# Patient Record
Sex: Female | Born: 1995 | Race: White | Hispanic: No | Marital: Single | Smoking: Current every day smoker
Health system: Southern US, Community
[De-identification: ages and names within clinical notes are randomized; demographics above are authoritative.]

## PROBLEM LIST (undated history)

## (undated) DIAGNOSIS — J45909 Unspecified asthma, uncomplicated: Secondary | ICD-10-CM

## (undated) DIAGNOSIS — T7840XA Allergy, unspecified, initial encounter: Secondary | ICD-10-CM

## (undated) DIAGNOSIS — F419 Anxiety disorder, unspecified: Secondary | ICD-10-CM

## (undated) DIAGNOSIS — E119 Type 2 diabetes mellitus without complications: Secondary | ICD-10-CM

## (undated) HISTORY — DX: Type 2 diabetes mellitus without complications: E11.9

## (undated) HISTORY — DX: Unspecified asthma, uncomplicated: J45.909

## (undated) HISTORY — DX: Allergy, unspecified, initial encounter: T78.40XA

## (undated) HISTORY — DX: Anxiety disorder, unspecified: F41.9

---
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
## 2014-08-16 IMAGING — CT CT HEAD WITHOUT CONTRAST
1 series · 16 of 30 positions shown, 20 images · non-contrast
Comparison: None.

CLINICAL DATA: Syncope

EXAM:
CT HEAD WITHOUT CONTRAST
TECHNIQUE: Contiguous axial images were obtained from the base of the skull
through the vertex without intravenous contrast.

[Series 2: head wo · axial · 0.41mm/px · z∈[-103,+25]mm · 16 of 30 slices shown, 20 images]
[im 2/30  brain]
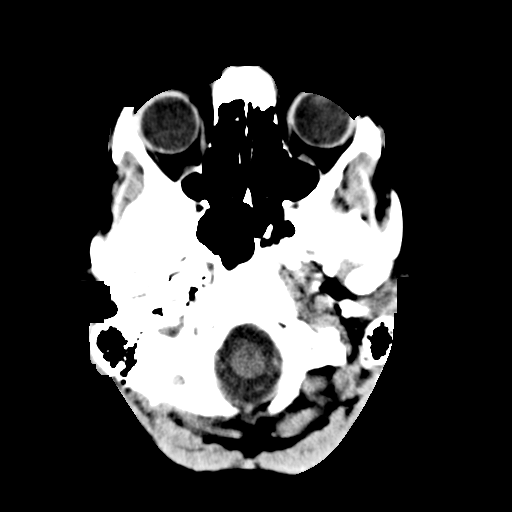
[im 2/30  bone]
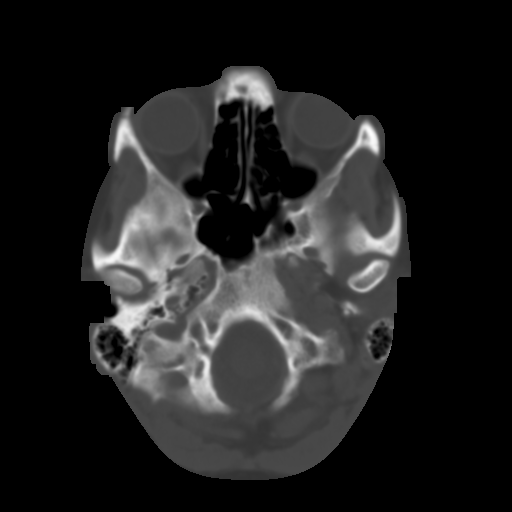
[im 4/30  brain]
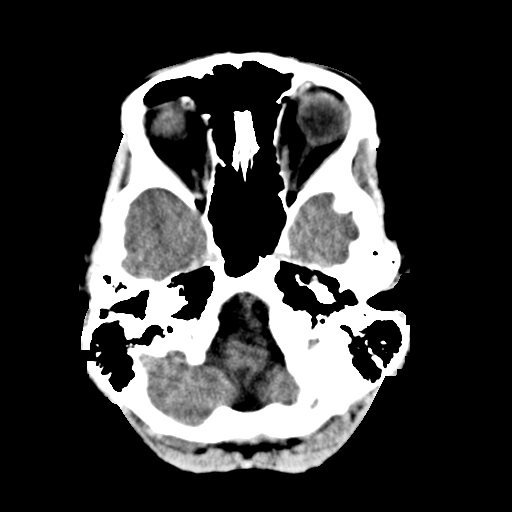
[im 6/30  brain]
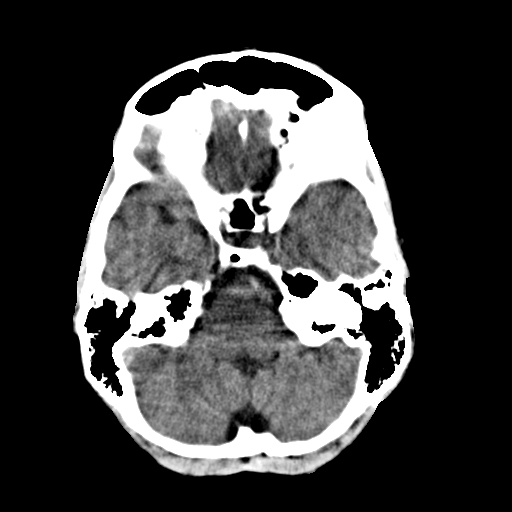
[im 8/30  brain]
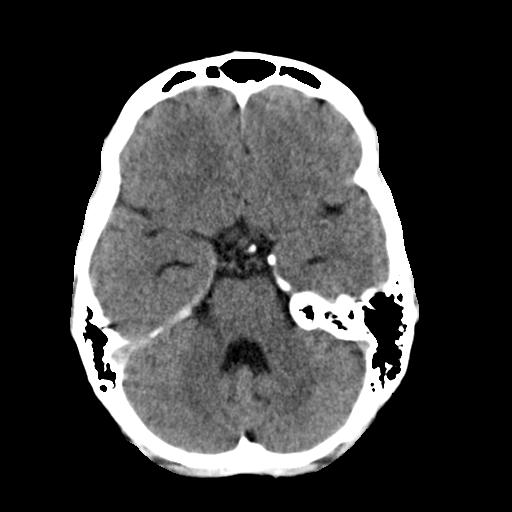
[im 9/30  brain]
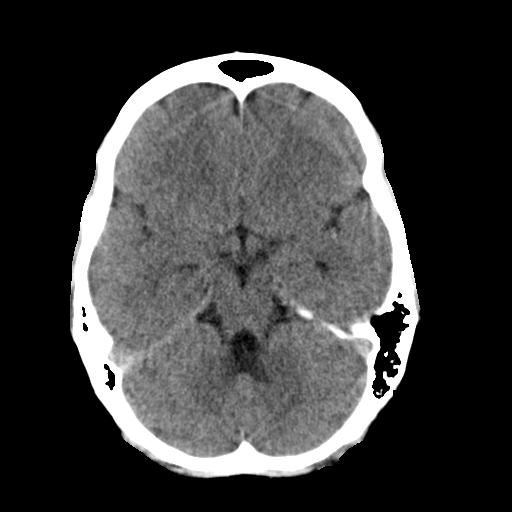
[im 9/30  bone]
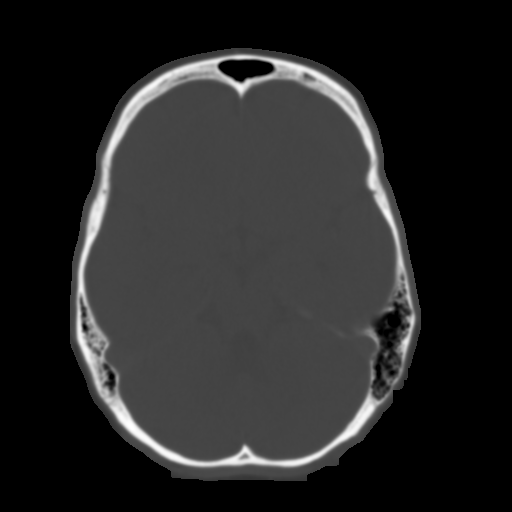
[im 11/30  brain]
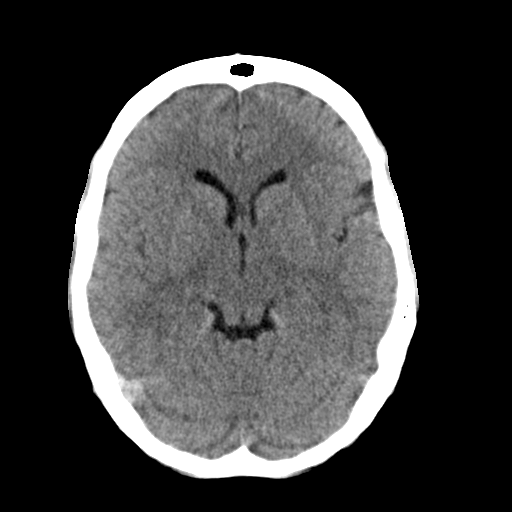
[im 13/30  brain]
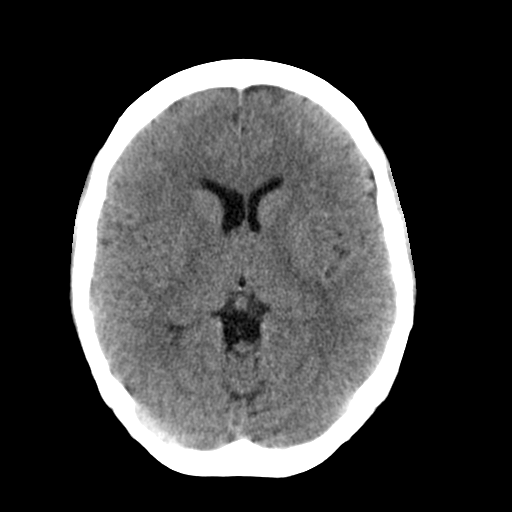
[im 15/30  brain]
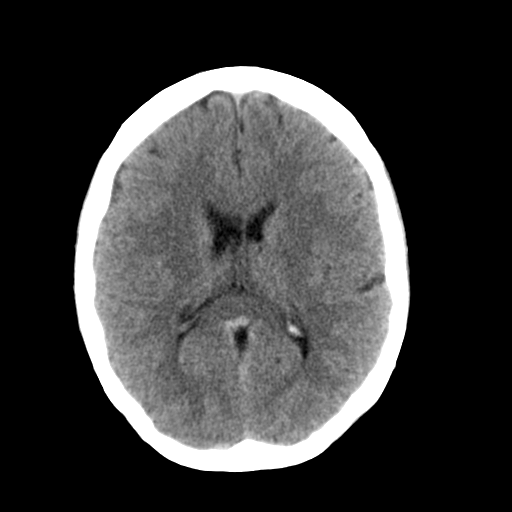
[im 16/30  brain]
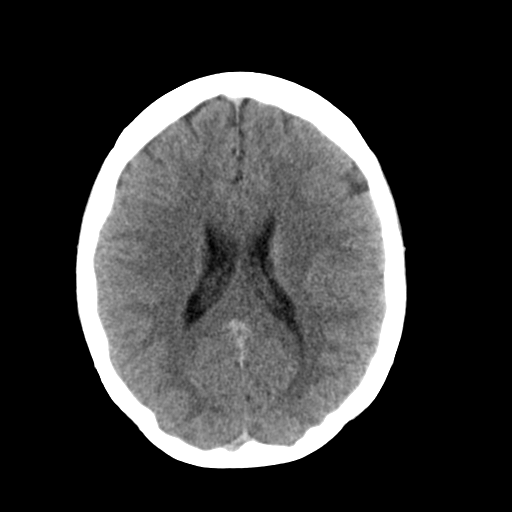
[im 16/30  bone]
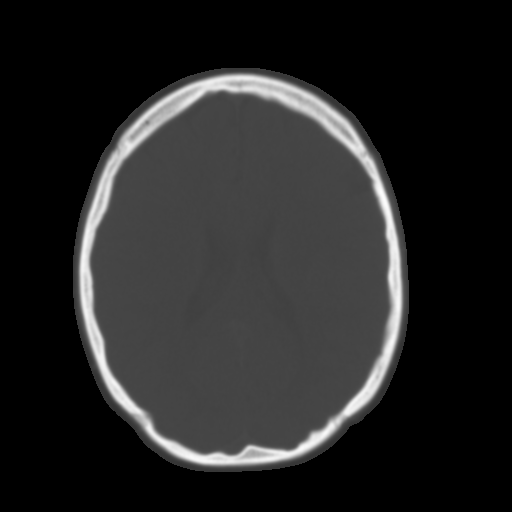
[im 18/30  brain]
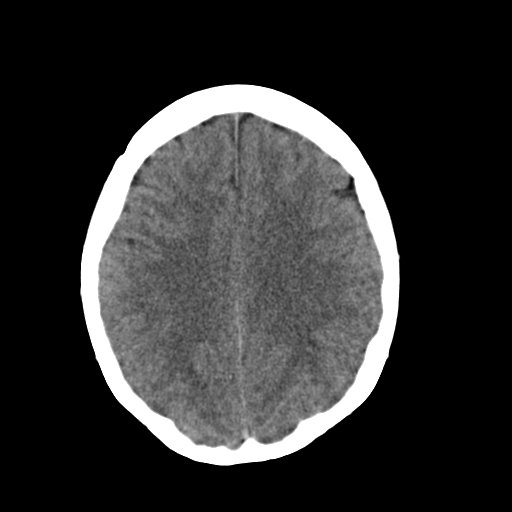
[im 20/30  brain]
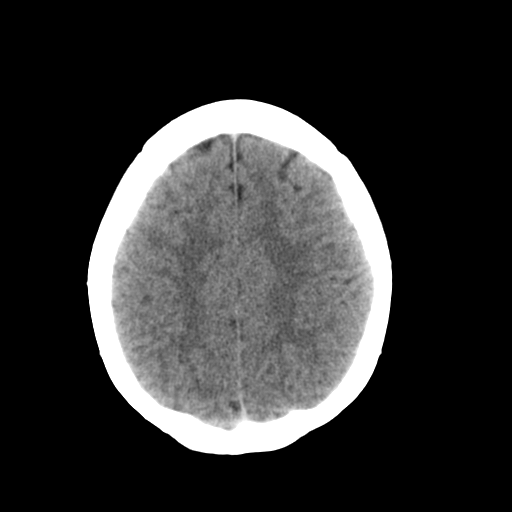
[im 22/30  brain]
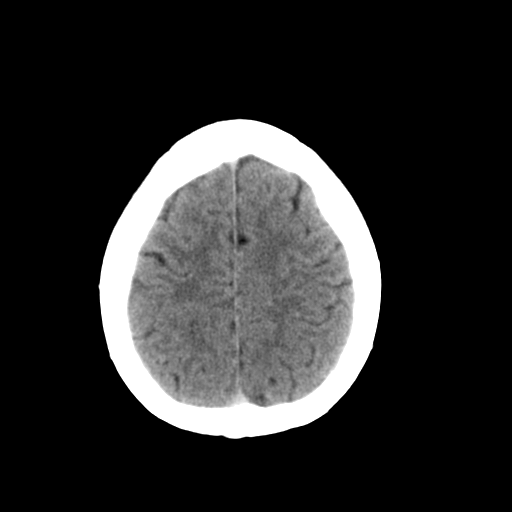
[im 23/30  brain]
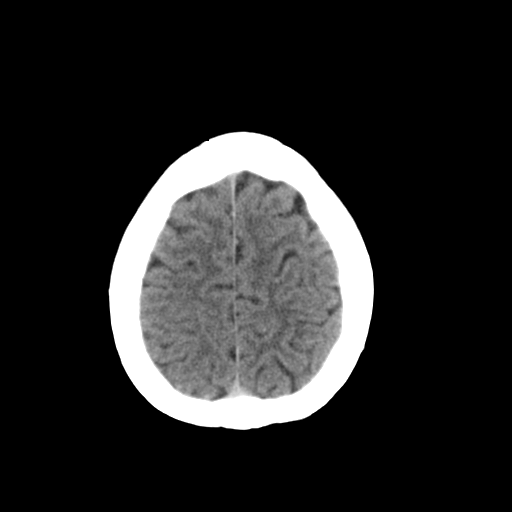
[im 23/30  bone]
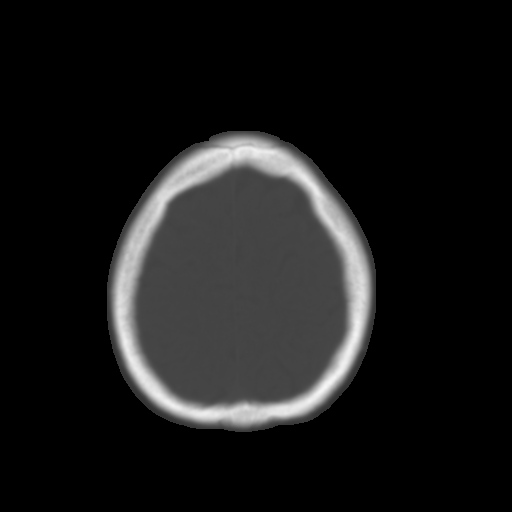
[im 25/30  brain]
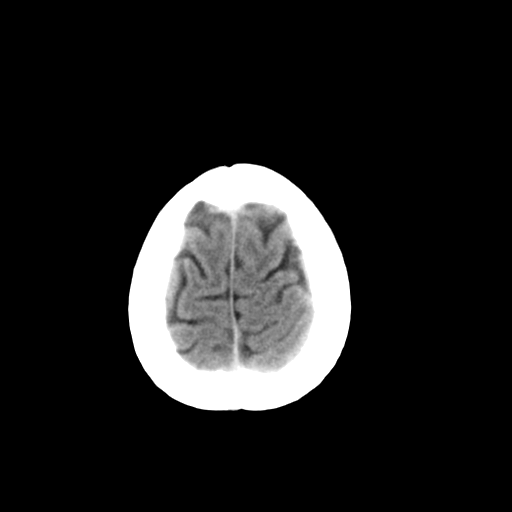
[im 27/30  brain]
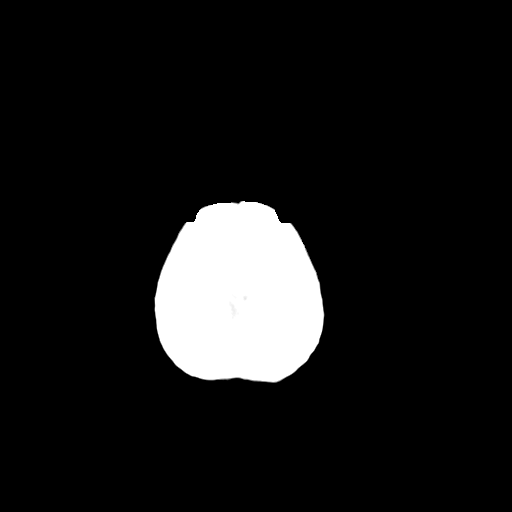
[im 29/30  brain]
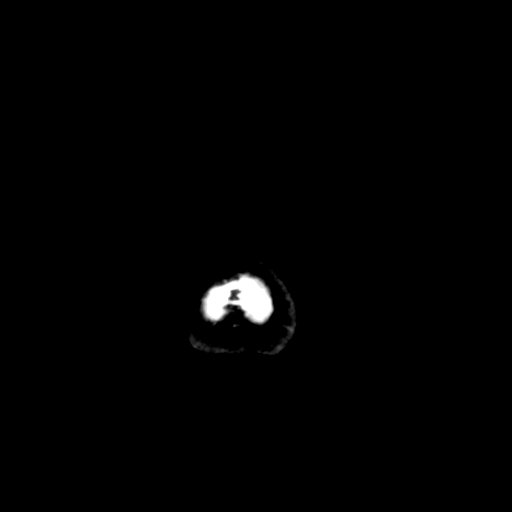

[16 of 30 positions shown; findings below may reference images not displayed]

FINDINGS: No acute intracranial abnormality is identified. Specifically,
negative for intra or extra-axial hemorrhage, mass effect, mass
lesion, hydrocephalus, or evidence of acute cortically based
infarction. The skull is intact. The visualized paranasal sinuses
and mastoid air cells are clear.
IMPRESSION: No acute intracranial abnormality.

## 2014-09-06 ENCOUNTER — Ambulatory Visit: Payer: Self-pay | Admitting: Family Medicine

## 2014-10-12 ENCOUNTER — Encounter
Admit: 2014-10-12 | Disposition: A | Discharge status: Home / Self Care | Payer: Self-pay | Attending: Family Medicine | Admitting: Family Medicine

## 2014-10-20 ENCOUNTER — Ambulatory Visit: Payer: Medicaid Other | Attending: Family Medicine | Admitting: Physical Therapy

## 2014-10-20 ENCOUNTER — Encounter: Payer: Self-pay | Admitting: Physical Therapy

## 2014-10-20 DIAGNOSIS — S39012S Strain of muscle, fascia and tendon of lower back, sequela: Secondary | ICD-10-CM | POA: Diagnosis not present

## 2014-10-20 DIAGNOSIS — S134XXS Sprain of ligaments of cervical spine, sequela: Secondary | ICD-10-CM | POA: Insufficient documentation

## 2014-10-20 DIAGNOSIS — M545 Low back pain, unspecified: Secondary | ICD-10-CM

## 2014-10-20 DIAGNOSIS — M542 Cervicalgia: Secondary | ICD-10-CM | POA: Insufficient documentation

## 2014-10-20 DIAGNOSIS — R293 Abnormal posture: Secondary | ICD-10-CM | POA: Insufficient documentation

## 2014-10-20 DIAGNOSIS — X58XXXS Exposure to other specified factors, sequela: Secondary | ICD-10-CM | POA: Insufficient documentation

## 2014-10-20 NOTE — Patient Instructions (Signed)
Neck: Retraction   Sit with back and head straight. Pull chin back to line up ear with shoulder. Do not turn or tilt head.  Hold _5___ seconds. Repeat 10____ times. Do __2__ sessions per day. CAUTION: Movement should be gentle, steady and slow.   Copyright  VHI. All rights reserved.  Shoulder Retraction   Facing chest height anchor, grasp ends of band and pull hands to chest, squeezing shoulder blades together. Hold _2__ seconds. Repeat _10__ times. Do 2___ sessions per day. Safety Note: Be sure anchor is secure.  Copyright  VHI. All rights reserved.  Shoulder Extension (Sit Box)   Use green tband tied around door knob. Pull arms backward. Keep spine neutral and squeeze shoulder blades together. Do ___ times, ___ times per day.  Copyright  VHI. All rights reserved.  Copyright  VHI. All rights reserved.  Reverse Fly / Shoulder Retraction   Extend both arms in front of body at shoulder height, palms down, holding green band. Move arms out to sides, squeeze shoulder blades together. Repeat _10__ times. Do __2_ sessions per day.   Copyright  VHI. All rights reserved.   Finish with posterior shoulder rolls (arms by side and roll shoulders backwards) repeat 10 times 2x a day;  You could also lay down on back with two tennis balls (taped together) under base of skill for 2-3 minutes (or as tolerated)
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
## 2014-10-22 ENCOUNTER — Ambulatory Visit: Payer: Medicaid Other | Attending: Family Medicine | Admitting: Physical Therapy

## 2014-10-22 ENCOUNTER — Encounter: Payer: Self-pay | Admitting: Physical Therapy

## 2014-10-22 DIAGNOSIS — R293 Abnormal posture: Secondary | ICD-10-CM

## 2014-10-22 DIAGNOSIS — M545 Low back pain, unspecified: Secondary | ICD-10-CM

## 2014-10-22 DIAGNOSIS — S134XXS Sprain of ligaments of cervical spine, sequela: Secondary | ICD-10-CM | POA: Diagnosis not present

## 2014-10-22 DIAGNOSIS — M542 Cervicalgia: Secondary | ICD-10-CM

## 2014-10-22 NOTE — Patient Instructions (Signed)
CERVICAL ROTATION WITH OVERPRESSURE -- IMAGE FROM [email or web address]   With good posture rotate your head to one side as far as possible without pain and use two fingers on the opposite side (if you turn to the left, 2 fingers on right cheek) and rotate back to the middle, then turn back further to the left.   Perform as needed. (10 repetitions 2 second hold)   CERVICAL FLEXION -- Image from [email or web address]   Tilt your head downwards, then return back to looking straight ahead.    Go to the point of stretch, no Pain. Can add overpressure with your hand at the top of the head.   Perform as needed. (10 repetitions 3 second hold)   Anterior/Med Scalene Stretch -- Image from [email or web address]   Pull your head to the side with one arm while the other arm stays in a relaxed position by your side.  Next look towards the ceiling while you are continuing to pull with one hand.  Finally, apply a chin tuck to the final position for the final stretch.    Perform as needed!  (10 repetitions 3 second hold)

## 2014-10-22 NOTE — Therapy (Signed)
[place] [facility] [address] [place], [place], [address] Phone: [phone]   Fax:  [phone]  Physical Therapy Treatment  Patient Details  Name: Jessica Calderon MRN: [id number] Date of Birth: June 23, 1995 Referring Provider:  No ref. provider found  Encounter Date: 10/22/2014      PT End of Session - 10/22/14 0905    Visit Number 3   Number of Visits 8   Date for PT Re-Evaluation 11/09/14   PT Start Time 0822   PT Stop Time 0905   PT Time Calculation (min) 43 min   Activity Tolerance Patient tolerated treatment well   Behavior During Therapy [facility] for tasks assessed/performed;Flat affect      Past Medical History  Diagnosis Date  . Anxiety   . Diabetes mellitus without complication     hypoglycemia  . Allergy   . Asthma     No past surgical history on file.  There were no vitals filed for this visit.  Visit Diagnosis:  Right-sided low back pain without sciatica  Cervicalgia  Abnormal posture      Subjective Assessment - 10/22/14 0822    Subjective Patient reports that her pain is off an on, it is worst in the mornings and evenings. It really depends on what she does, she states that laying in bed, gardening, and working with a doghouse have increased her symptoms.    Limitations Other (comment)  Limited driving.   Currently in Pain? Yes   Pain Score 4    Pain Location Neck   Pain Descriptors / Indicators Aching   Pain Type Chronic pain   Pain Radiating Towards Right side radiating towards left    Pain Onset 1 to 4 weeks ago   Pain Frequency Intermittent   Multiple Pain Sites Yes   Pain Score 4   Pain Location Back   Pain Orientation Lower   Pain Descriptors / Indicators Aching   Pain Type Chronic pain   Pain Onset 1 to 4 weeks ago   Pain Frequency Intermittent     Manual therapy  P-A Thoracic mobilizations provided grade I for pain relief Soft tissue mobilizations provided to rhomboids, upper  trapezius, and scalenes bilaterally for pain relief. Well tolerated.  First rib mobilizaiton grade I for pain relief provided on right in prone. Well tolerated.    Therapeutic Exercise  Low rows with green band x 10 with cuing for scapular adduction and retraction, rather than glenohumeral extension.  Mid rows with green band x 10 with cuing for scapular adduction and retraction, rather than glenohumeral extension. Band pull aparts x 10 with green band with cuing for scapular adduction and retraction, rather than glenohumeral extension Patient educated on sit <--> stand technique and carry over for squat. Patient educated that squatting with neutral spine would alleviate pressure/pain she notes with picking objects off of the floor. Patient completed 5 x repetitions of sit to stand, and cued to have wider stance and some external rotation at her feet to minimize her need for DF. Patient acknowledged no pain while picking up 7# dumbbell. Patient educated to lift objects when they are as close to her as possible. She completed x 8 repetitions with 7# dumbbell.                            PT Education - 10/22/14 0909    Education provided Yes   Education Details Increased HEP, provided  patient with therapeutic neuroscience education, reinforced HEP by repeating initially provactive movements and asking for changes.    Person(s) Educated Patient   Methods Explanation;Demonstration;Verbal cues;Handout   Comprehension Verbalized understanding;Returned demonstration;Tactile cues required             PT Long Term Goals - 10/22/14 0914    PT LONG TERM GOAL #1   Title Patient will report a maximum of 2 sleep disturbances in the last week to improve overall quality of life and reduce fatigue; target 11/09/14   Status New   PT LONG TERM GOAL #2   Title Patietn will be independent in HEP to improve strength/mobility for better functional independence with ADLs by 11/09/14    Status New   PT LONG TERM GOAL #3   Title Patient will report a worst pain of 3/10 on VAS in neck/low back to improve tolerance with ADLs and reduced symptoms with activities by 11/09/14   Status New   PT LONG TERM GOAL #4   Title Patient will icnrease BLE gross strength to 4+/5 as to improve functioanl strength fo rindependent gait, increased standing tolerance and increased ADL ability by 11/09/14   Status New   PT LONG TERM GOAL #5   Title Patient will increase lumbar extension strength to at least 4/5 as to improve bgross strength fo rsitting/standing tolerance with better erect posture for increased toelrance with ADLs by 11/09/14   Status New   PT LONG TERM GOAL #6   Title Patietn will reduce modified Oswestry score to <20 as to demonstrate minimal disability with ADLs including improved sleeping tolerance, walking/sitting tolerance etc for better mobility with ADLs by 11/09/14   Status New               Plan - 10/22/14 0911    Clinical Impression Statement Patient is a 19 y/o female that presents with cervical and mid thoracic pain, which radiates bilaterally. Patient responded very well to manual techniques and self-stretching program provided and stated she did not have any pain or restriction in motion after treatment today. Patient performed repeated testing with cervical ROM after interventions, and stated she was pain free after additional stretches added to her HEP today. Patient educated to continue stretching and strengthening program as needed, as it was shwon to be effective today. Patient would benefit from continued skilled PT to address any additional symptoms she [place] experience as well as improve her posture both statically and dynamically with lifting tasks.    Pt will benefit from skilled therapeutic intervention in order to improve on the following deficits Improper body mechanics;Impaired flexibility;Decreased range of motion;Postural dysfunction;Decreased activity  tolerance;Pain;Decreased strength;Decreased mobility   Rehab Potential Excellent  Excellent response to treatment today.   PT Frequency 2x / week   PT Duration 4 weeks   PT Treatment/Interventions Therapeutic exercise;Therapeutic activities;Dry needling;ADLs/Self Care Home Management;Cryotherapy;Moist Heat;Ultrasound;Electrical Stimulation;Manual techniques   PT Next Visit Plan Follow up on HEP and assess for any modifications. Perform manual treatments as needed, reinforce proper lifting mechanics.    PT Home Exercise Plan please see patient instructions   Consulted and Agree with Plan of Care Patient        Problem List There are no active problems to display for this patient.  Kerin RansomPatrick A McNamara, PT, DPT     [place] [facility] [address] [place], [place], [address] Phone: [phone]   Fax:  [phone]

## 2014-10-27 ENCOUNTER — Encounter: Payer: Self-pay | Admitting: Physical Therapy

## 2014-10-27 ENCOUNTER — Ambulatory Visit: Payer: Medicaid Other | Attending: Family Medicine | Admitting: Physical Therapy

## 2014-10-27 DIAGNOSIS — X58XXXS Exposure to other specified factors, sequela: Secondary | ICD-10-CM | POA: Insufficient documentation

## 2014-10-27 DIAGNOSIS — S134XXS Sprain of ligaments of cervical spine, sequela: Secondary | ICD-10-CM | POA: Insufficient documentation

## 2014-10-27 DIAGNOSIS — S39012S Strain of muscle, fascia and tendon of lower back, sequela: Secondary | ICD-10-CM | POA: Insufficient documentation

## 2014-10-27 DIAGNOSIS — R293 Abnormal posture: Secondary | ICD-10-CM

## 2014-10-27 DIAGNOSIS — M545 Low back pain, unspecified: Secondary | ICD-10-CM

## 2014-10-27 DIAGNOSIS — M542 Cervicalgia: Secondary | ICD-10-CM

## 2014-10-27 NOTE — Therapy (Signed)
[place] [facility] [address] [place], [place], [address] Phone: [phone]   Fax:  [phone]  Physical Therapy Treatment  Patient Details  Name: Thane Edulesa Taylor Rentz MRN: [id number] Date of Birth: 1995/07/14 Referring Provider:  Sula RumpleVirk, Charanjit, MD  Encounter Date: 10/27/2014      PT End of Session - 10/27/14 1158    Visit Number 4   Number of Visits 8   Date for PT Re-Evaluation 11/09/14   PT Start Time 1130   PT Stop Time 1200   PT Time Calculation (min) 30 min   Equipment Utilized During Treatment --  moist heat   Activity Tolerance Patient tolerated treatment well   Behavior During Therapy [facility] for tasks assessed/performed;Flat affect      Past Medical History  Diagnosis Date  . Anxiety   . Diabetes mellitus without complication     hypoglycemia  . Allergy   . Asthma     History reviewed. No pertinent past surgical history.  There were no vitals filed for this visit.  Visit Diagnosis:  Right-sided low back pain without sciatica  Cervicalgia  Abnormal posture      Subjective Assessment - 10/27/14 1133    Subjective Patient denies any pain currently; She reports mixed compliance with HEP as she was busy over the weekend with Mother's Day. Patient reports waking up once without pain; She reports having to stay in hospital with family member and having pain then.    Limitations Other (comment)   Currently in Pain? No/denies            [facility] PT Assessment - 10/27/14 0001    Functional Tests   Functional tests --   Standardized Balance Assessment   Standardized Balance Assessment --     TREATMENT: Warm up on UBE, BUE level 2 backwards only x3 min (unbilled); Prone: -BUE shoulder extension 2# x10; -BUE shoulder horizontal abduction 2# x10; -UE rows 2#, 2x10 bilaterally  Pball on wall push-ups x10 with cues for scapular protraction upon push-up; Standing facing wall: Alternate UE lift off wall  with scapular retraction/depression x10 bilaterally;  Finished with posterior shoulder rolls x10;  Patient required min-moderate verbal/tactile cues for correct exercise technique including to increase scapular retraction during tband rows/extension for better postural strengthening; Patient reports increased fatigue and discomfort after exercise related to muscle weakness.   Finished with moist heat to thoracolumbar paraspinals x5 min (unbilled);                      PT Education - 10/27/14 1156    Education provided Yes   Education Details posture and correct exercise technique;    Person(s) Educated Patient   Methods Explanation;Demonstration   Comprehension Verbalized understanding;Returned demonstration;Verbal cues required             PT Long Term Goals - 10/22/14 0914    PT LONG TERM GOAL #1   Title Patient will report a maximum of 2 sleep disturbances in the last week to improve overall quality of life and reduce fatigue; target 11/09/14   Status New   PT LONG TERM GOAL #2   Title Patietn will be independent in HEP to improve strength/mobility for better functional independence with ADLs by 11/09/14   Status New   PT LONG TERM GOAL #3   Title Patient will report a worst pain of 3/10 on VAS in neck/low back to improve tolerance with ADLs and reduced symptoms with activities by  11/09/14   Status New   PT LONG TERM GOAL #4   Title Patient will icnrease BLE gross strength to 4+/5 as to improve functioanl strength fo rindependent gait, increased standing tolerance and increased ADL ability by 11/09/14   Status New   PT LONG TERM GOAL #5   Title Patient will increase lumbar extension strength to at least 4/5 as to improve bgross strength fo rsitting/standing tolerance with better erect posture for increased toelrance with ADLs by 11/09/14   Status New   PT LONG TERM GOAL #6   Title Patietn will reduce modified Oswestry score to <20 as to demonstrate minimal  disability with ADLs including improved sleeping tolerance, walking/sitting tolerance etc for better mobility with ADLs by 11/09/14   Status New               Plan - 10/27/14 1141    Clinical Impression Statement Patient was late to appointment; Focused on postural strengthening with resisted handweights; Patient required increased cues for better positioning during UE exercise; She fatigues quickly with advanced repetitions. She would benefit from additional skilled PT intervention to improve UE strength and postural support to reduce pain.    Pt will benefit from skilled therapeutic intervention in order to improve on the following deficits Improper body mechanics;Impaired flexibility;Decreased range of motion;Postural dysfunction;Decreased activity tolerance;Pain;Decreased strength;Decreased mobility   Rehab Potential Good   PT Frequency 2x / week   PT Duration 4 weeks   PT Treatment/Interventions Therapeutic exercise;Therapeutic activities;Dry needling;ADLs/Self Care Home Management;Cryotherapy;Moist Heat;Ultrasound;Electrical Stimulation;Manual techniques   PT Next Visit Plan try resisted weight machines (although pt anxious about exercise); work on strengthening (very weak)   PT Home Exercise Plan continue with HEP as given at previous session        Problem List There are no active problems to display for this patient.   Hopkins,Randol Zumstein 10/27/2014, 12:57 PM  [place] [facility] [address] [place], [place], [address] Phone: [phone]   Fax:  [phone]

## 2014-10-29 ENCOUNTER — Ambulatory Visit: Payer: Medicaid Other | Admitting: Physical Therapy

## 2014-11-02 ENCOUNTER — Ambulatory Visit: Payer: Medicaid Other | Admitting: Physical Therapy

## 2014-11-02 DIAGNOSIS — M545 Low back pain, unspecified: Secondary | ICD-10-CM

## 2014-11-02 DIAGNOSIS — M542 Cervicalgia: Secondary | ICD-10-CM

## 2014-11-02 DIAGNOSIS — S134XXS Sprain of ligaments of cervical spine, sequela: Secondary | ICD-10-CM | POA: Diagnosis not present

## 2014-11-02 DIAGNOSIS — R293 Abnormal posture: Secondary | ICD-10-CM

## 2014-11-02 NOTE — Therapy (Signed)
[place] MAIN [facility] SERVICES [address] [place], [place], [address] Phone: [phone]   Fax:  [phone]  Physical Therapy Treatment  Patient Details  Name: Jessica Calderon MRN: [id number] Date of Birth: July 23, 1995 Referring Provider:  Sherrin Daisy, MD  Encounter Date: 11/02/2014      PT End of Session - 11/02/14 1107    Visit Number 5   Number of Visits 8   Date for PT Re-Evaluation 11/09/14   PT Start Time 1025   PT Stop Time 1105   PT Time Calculation (min) 40 min   Activity Tolerance Patient tolerated treatment well   Behavior During Therapy [facility] for tasks assessed/performed      Past Medical History  Diagnosis Date  . Anxiety   . Diabetes mellitus without complication     hypoglycemia  . Allergy   . Asthma     No past surgical history on file.  There were no vitals filed for this visit.  Visit Diagnosis:  Right-sided low back pain without sciatica  Cervicalgia  Abnormal posture      Subjective Assessment - 11/02/14 1030    Subjective Patient denies pain today, she states she popped her back and neck yesterday to relieve pressure as she was having back pain and this relieved that. Patient reports that sleeping and being in a car for too long can onset her pain. She still reports some issues with sleep being uncomfortable.    Patient Stated Goals To complete ADLs pain free and return to PLOF.    Currently in Pain? No/denies     Ther-Ex   Prone press ups x 10 Thoracic extension over chair x 10, mild increase in pain in lumbar spine.  Incline push-ups to bench x 12 Mid rows x 12 with red t-band 2 sets  Low rows x 12 with red t-band 2 sets  Standing Y's from wall bilaterally x 10 Scapular retractions x 15 Thoracic extensions over foam roller x 8 in C-T, Mid thoracic, and lower thoracic   Patient cued for lower trap activation in t-band exercises and standing Y's. Patient educated to perform scapular  retractions any time she is sitting for a prolonged period of time.  Patient educated to keep her elbows in on incline push-ups.  Patient educated to hold stretching position for extensions and prone press-ups for 2-3 seconds then relax for each repetition.                            PT Education - 11/02/14 1107    Education provided Yes   Education Details Patient educated some pain is normal after an accident and that the more she returns to her normal activities, even if they are mildly painful the better her long term outcome. Patient educated on the need to exercise and move to increase her strength and tolerance for activity.    Person(s) Educated Patient   Methods Explanation;Demonstration   Comprehension Verbalized understanding;Returned demonstration;Verbal cues required             PT Long Term Goals - 11/02/14 1035    PT LONG TERM GOAL #1   Title Patient will report a maximum of 2 sleep disturbances in the last week to improve overall quality of life and reduce fatigue; target 11/09/14   Status Partially Met   PT LONG TERM GOAL #2   Title Patietn will be independent in HEP to improve strength/mobility for  better functional independence with ADLs by 11/09/14   Status Partially Met   PT LONG TERM GOAL #3   Title Patient will report a worst pain of 3/10 on VAS in neck/low back to improve tolerance with ADLs and reduced symptoms with activities by 11/09/14   Status On-going   PT LONG TERM GOAL #4   Title Patient will icnrease BLE gross strength to 4+/5 as to improve functioanl strength fo rindependent gait, increased standing tolerance and increased ADL ability by 11/09/14   Status Achieved   PT LONG TERM GOAL #5   Title Patient will increase lumbar extension strength to at least 4/5 as to improve bgross strength fo rsitting/standing tolerance with better erect posture for increased toelrance with ADLs by 11/09/14   Status On-going   PT LONG TERM GOAL #6    Title Patietn will reduce modified Oswestry score to <20 as to demonstrate minimal disability with ADLs including improved sleeping tolerance, walking/sitting tolerance etc for better mobility with ADLs by 11/09/14   Status Partially Met               Plan - 11/02/14 1111    Clinical Impression Statement Patient continues to display improvement in activity tolerance, though her ODI score is 20 today as it was in her eval. She was late to her appointment again today and has been intermittent with her HEP and states her pain ranges from 0-7. She appears to have full ROM, and is now limited mostly by her weakness and decreased adherence to her HEP. She is able to relieve her pain with exercises and movement, which patient was educated on extensively today. Patient would benefit from a maintenance program to increase her postural and trunkal strength to decrease her pain symptoms. Skilled PT services are indicated at this time to address her pain and mobility deficits.    Pt will benefit from skilled therapeutic intervention in order to improve on the following deficits Improper body mechanics;Impaired flexibility;Decreased range of motion;Postural dysfunction;Decreased activity tolerance;Pain;Decreased strength;Decreased mobility   Rehab Potential Good   PT Frequency 2x / week   PT Duration 4 weeks   PT Treatment/Interventions Therapeutic exercise;Therapeutic activities;Dry needling;ADLs/Self Care Home Management;Cryotherapy;Moist Heat;Ultrasound;Electrical Stimulation;Manual techniques   PT Next Visit Plan Provide gym/home exercise program for postural and core strengthening for long term maintenance. Probable discharge.    PT Home Exercise Plan continue with HEP as given at previous session   Consulted and Agree with Plan of Care Patient        Problem List There are no active problems to display for this patient.   Kerman Passey, PT, DPT   11/02/2014, 11:21 AM  [place] MAIN [facility] SERVICES [address] [place], [place], [address] Phone: [phone]   Fax:  [phone]

## 2014-11-05 ENCOUNTER — Ambulatory Visit: Payer: Medicaid Other | Admitting: Physical Therapy

## 2014-11-09 ENCOUNTER — Encounter: Payer: Self-pay | Admitting: Physical Therapy

## 2014-11-09 ENCOUNTER — Ambulatory Visit: Payer: Medicaid Other | Admitting: Physical Therapy

## 2014-11-09 NOTE — Therapy (Signed)
[place] MAIN [facility] SERVICES [address] [place], [place], [address] Phone: [phone]   Fax:  [phone]  Physical Therapy Discharge Summary  Patient Details  Name: Jessica Calderon MRN: [id number] Date of Birth: 12/11/1995 Referring Provider:  No ref. provider found  Encounter Date: 11/09/2014    Past Medical History  Diagnosis Date  . Anxiety   . Diabetes mellitus without complication     hypoglycemia  . Allergy   . Asthma     No past surgical history on file.  There were no vitals filed for this visit.  Visit Diagnosis:  No diagnosis found.          PHYSICAL THERAPY DISCHARGE SUMMARY  Visits from Start of Care: 5 out of 8  Current functional level related to goals / functional outcomes: Patient no-showed final appointment. Called patient. She reports that she has been doing better since being more compliant with HEP; She reports overall less back/neck pain. She was agreeable to discharge at this time.   Remaining deficits: Intermittent pain that does not affect functional mobility   Education / Equipment: Provided extensive HEP and education on lifting technique, posture and body mechanics to reduce back/neck pain.  Plan: Patient agrees to discharge.  Patient goals were met. Patient is being discharged due to meeting the stated rehab goals.  ?????         unable to obtain objective measures as patient did not attend final therapy session.                        PT Long Term Goals - 11/02/14 1035    PT LONG TERM GOAL #1   Title Patient will report a maximum of 2 sleep disturbances in the last week to improve overall quality of life and reduce fatigue; target 11/09/14   Status Partially Met   PT LONG TERM GOAL #2   Title Patietn will be independent in HEP to improve strength/mobility for better functional independence with ADLs by 11/09/14   Status Partially Met   PT LONG TERM GOAL #3   Title Patient will report a worst pain of 3/10 on VAS in neck/low back to improve tolerance with ADLs and reduced symptoms with activities by 11/09/14   Status On-going   PT LONG TERM GOAL #4   Title Patient will icnrease BLE gross strength to 4+/5 as to improve functioanl strength fo rindependent gait, increased standing tolerance and increased ADL ability by 11/09/14   Status Achieved   PT LONG TERM GOAL #5   Title Patient will increase lumbar extension strength to at least 4/5 as to improve bgross strength fo rsitting/standing tolerance with better erect posture for increased toelrance with ADLs by 11/09/14   Status On-going   PT LONG TERM GOAL #6   Title Patietn will reduce modified Oswestry score to <20 as to demonstrate minimal disability with ADLs including improved sleeping tolerance, walking/sitting tolerance etc for better mobility with ADLs by 11/09/14   Status Partially Met               Problem List There are no active problems to display for this patient.   Alessandra Grout, PT, DPT, [phone] 11/09/2014 11:14 AM   [place] MAIN [facility] SERVICES [address] [place], [place], [address] Phone: [phone]   Fax:  [phone]

## 2014-11-12 ENCOUNTER — Ambulatory Visit: Payer: Medicaid Other | Admitting: Physical Therapy

## 2016-09-25 DIAGNOSIS — S61412A Laceration without foreign body of left hand, initial encounter: Secondary | ICD-10-CM | POA: Diagnosis not present

## 2018-05-14 DIAGNOSIS — Z6823 Body mass index (BMI) 23.0-23.9, adult: Secondary | ICD-10-CM | POA: Diagnosis not present

## 2018-05-14 DIAGNOSIS — Z309 Encounter for contraceptive management, unspecified: Secondary | ICD-10-CM | POA: Diagnosis not present

## 2018-05-14 DIAGNOSIS — A6009 Herpesviral infection of other urogenital tract: Secondary | ICD-10-CM | POA: Diagnosis not present

## 2018-05-14 DIAGNOSIS — Z1331 Encounter for screening for depression: Secondary | ICD-10-CM | POA: Diagnosis not present

## 2018-08-06 DIAGNOSIS — Z309 Encounter for contraceptive management, unspecified: Secondary | ICD-10-CM | POA: Diagnosis not present

## 2018-08-06 DIAGNOSIS — M7071 Other bursitis of hip, right hip: Secondary | ICD-10-CM | POA: Diagnosis not present

## 2018-08-06 DIAGNOSIS — A6009 Herpesviral infection of other urogenital tract: Secondary | ICD-10-CM | POA: Diagnosis not present

## 2018-08-06 DIAGNOSIS — Z6823 Body mass index (BMI) 23.0-23.9, adult: Secondary | ICD-10-CM | POA: Diagnosis not present

## 2018-08-08 DIAGNOSIS — Q6589 Other specified congenital deformities of hip: Secondary | ICD-10-CM | POA: Diagnosis not present

## 2018-10-31 DIAGNOSIS — Z3009 Encounter for other general counseling and advice on contraception: Secondary | ICD-10-CM | POA: Diagnosis not present

## 2018-10-31 DIAGNOSIS — Z309 Encounter for contraceptive management, unspecified: Secondary | ICD-10-CM | POA: Diagnosis not present

## 2018-10-31 DIAGNOSIS — Z6824 Body mass index (BMI) 24.0-24.9, adult: Secondary | ICD-10-CM | POA: Diagnosis not present

## 2018-10-31 DIAGNOSIS — A6009 Herpesviral infection of other urogenital tract: Secondary | ICD-10-CM | POA: Diagnosis not present

## 2019-01-23 DIAGNOSIS — Z309 Encounter for contraceptive management, unspecified: Secondary | ICD-10-CM | POA: Diagnosis not present

## 2019-04-17 DIAGNOSIS — Z3009 Encounter for other general counseling and advice on contraception: Secondary | ICD-10-CM | POA: Diagnosis not present

## 2019-04-17 DIAGNOSIS — Z309 Encounter for contraceptive management, unspecified: Secondary | ICD-10-CM | POA: Diagnosis not present

## 2019-07-15 DIAGNOSIS — Z309 Encounter for contraceptive management, unspecified: Secondary | ICD-10-CM | POA: Diagnosis not present

## 2019-10-06 DIAGNOSIS — Z309 Encounter for contraceptive management, unspecified: Secondary | ICD-10-CM | POA: Diagnosis not present

## 2019-12-29 DIAGNOSIS — Z309 Encounter for contraceptive management, unspecified: Secondary | ICD-10-CM | POA: Diagnosis not present

## 2019-12-29 DIAGNOSIS — Z87891 Personal history of nicotine dependence: Secondary | ICD-10-CM | POA: Diagnosis not present

## 2019-12-29 DIAGNOSIS — Z3009 Encounter for other general counseling and advice on contraception: Secondary | ICD-10-CM | POA: Diagnosis not present

## 2019-12-29 DIAGNOSIS — Z0000 Encounter for general adult medical examination without abnormal findings: Secondary | ICD-10-CM | POA: Diagnosis not present

## 2019-12-29 DIAGNOSIS — Z1331 Encounter for screening for depression: Secondary | ICD-10-CM | POA: Diagnosis not present

## 2019-12-29 DIAGNOSIS — E663 Overweight: Secondary | ICD-10-CM | POA: Diagnosis not present

## 2020-04-04 DIAGNOSIS — Z3009 Encounter for other general counseling and advice on contraception: Secondary | ICD-10-CM | POA: Diagnosis not present

## 2020-05-27 DIAGNOSIS — Z30017 Encounter for initial prescription of implantable subdermal contraceptive: Secondary | ICD-10-CM | POA: Diagnosis not present

## 2020-05-27 DIAGNOSIS — Z3202 Encounter for pregnancy test, result negative: Secondary | ICD-10-CM | POA: Diagnosis not present

## 2020-06-28 DIAGNOSIS — J45909 Unspecified asthma, uncomplicated: Secondary | ICD-10-CM | POA: Diagnosis not present

## 2020-06-28 DIAGNOSIS — A6009 Herpesviral infection of other urogenital tract: Secondary | ICD-10-CM | POA: Diagnosis not present

## 2020-06-28 DIAGNOSIS — Z6829 Body mass index (BMI) 29.0-29.9, adult: Secondary | ICD-10-CM | POA: Diagnosis not present
